# Patient Record
Sex: Female | Born: 1995
Health system: Southern US, Community
[De-identification: ages and names within clinical notes are randomized; demographics above are authoritative.]

---

## 2016-08-08 DIAGNOSIS — H52213 Irregular astigmatism, bilateral: Secondary | ICD-10-CM | POA: Diagnosis not present

## 2016-12-05 DIAGNOSIS — H52213 Irregular astigmatism, bilateral: Secondary | ICD-10-CM | POA: Diagnosis not present

## 2017-04-15 DIAGNOSIS — L910 Hypertrophic scar: Secondary | ICD-10-CM | POA: Diagnosis not present

## 2017-04-15 DIAGNOSIS — Z13220 Encounter for screening for lipoid disorders: Secondary | ICD-10-CM | POA: Diagnosis not present

## 2017-10-15 DIAGNOSIS — Z13220 Encounter for screening for lipoid disorders: Secondary | ICD-10-CM | POA: Diagnosis not present

## 2017-10-15 DIAGNOSIS — Z0000 Encounter for general adult medical examination without abnormal findings: Secondary | ICD-10-CM | POA: Diagnosis not present

## 2017-10-15 DIAGNOSIS — N912 Amenorrhea, unspecified: Secondary | ICD-10-CM | POA: Diagnosis not present

## 2017-10-15 DIAGNOSIS — L910 Hypertrophic scar: Secondary | ICD-10-CM | POA: Diagnosis not present

## 2017-10-29 DIAGNOSIS — L910 Hypertrophic scar: Secondary | ICD-10-CM | POA: Diagnosis not present

## 2017-10-29 DIAGNOSIS — L089 Local infection of the skin and subcutaneous tissue, unspecified: Secondary | ICD-10-CM | POA: Diagnosis not present

## 2017-10-29 DIAGNOSIS — L702 Acne varioliformis: Secondary | ICD-10-CM | POA: Diagnosis not present

## 2017-11-26 DIAGNOSIS — L910 Hypertrophic scar: Secondary | ICD-10-CM | POA: Diagnosis not present

## 2017-11-26 DIAGNOSIS — L702 Acne varioliformis: Secondary | ICD-10-CM | POA: Diagnosis not present

## 2017-11-26 DIAGNOSIS — L089 Local infection of the skin and subcutaneous tissue, unspecified: Secondary | ICD-10-CM | POA: Diagnosis not present

## 2017-12-31 DIAGNOSIS — L910 Hypertrophic scar: Secondary | ICD-10-CM | POA: Diagnosis not present

## 2017-12-31 DIAGNOSIS — L089 Local infection of the skin and subcutaneous tissue, unspecified: Secondary | ICD-10-CM | POA: Diagnosis not present

## 2018-02-04 DIAGNOSIS — L089 Local infection of the skin and subcutaneous tissue, unspecified: Secondary | ICD-10-CM | POA: Diagnosis not present

## 2018-02-04 DIAGNOSIS — L910 Hypertrophic scar: Secondary | ICD-10-CM | POA: Diagnosis not present

## 2018-03-04 DIAGNOSIS — L910 Hypertrophic scar: Secondary | ICD-10-CM | POA: Diagnosis not present

## 2018-03-04 DIAGNOSIS — L089 Local infection of the skin and subcutaneous tissue, unspecified: Secondary | ICD-10-CM | POA: Diagnosis not present

## 2018-04-01 DIAGNOSIS — L089 Local infection of the skin and subcutaneous tissue, unspecified: Secondary | ICD-10-CM | POA: Diagnosis not present

## 2018-04-01 DIAGNOSIS — L702 Acne varioliformis: Secondary | ICD-10-CM | POA: Diagnosis not present

## 2018-04-01 DIAGNOSIS — L910 Hypertrophic scar: Secondary | ICD-10-CM | POA: Diagnosis not present

## 2018-04-29 DIAGNOSIS — L089 Local infection of the skin and subcutaneous tissue, unspecified: Secondary | ICD-10-CM | POA: Diagnosis not present

## 2018-04-29 DIAGNOSIS — L910 Hypertrophic scar: Secondary | ICD-10-CM | POA: Diagnosis not present

## 2018-08-17 ENCOUNTER — Other Ambulatory Visit: Payer: Self-pay

## 2018-08-17 ENCOUNTER — Encounter: Payer: Self-pay | Admitting: Nurse Practitioner

## 2018-08-17 ENCOUNTER — Ambulatory Visit: Payer: 59 | Admitting: Nurse Practitioner

## 2018-08-17 VITALS — BP 140/76 | HR 83 | Temp 98.5°F | Ht 68.0 in | Wt 255.8 lb

## 2018-08-17 DIAGNOSIS — Z3A01 Less than 8 weeks gestation of pregnancy: Secondary | ICD-10-CM

## 2018-08-17 DIAGNOSIS — N912 Amenorrhea, unspecified: Secondary | ICD-10-CM | POA: Diagnosis not present

## 2018-08-17 DIAGNOSIS — Z3201 Encounter for pregnancy test, result positive: Secondary | ICD-10-CM | POA: Diagnosis not present

## 2018-08-17 LAB — POCT URINE PREGNANCY: Preg Test, Ur: POSITIVE — AB

## 2018-08-17 NOTE — Progress Notes (Signed)
  Subjective:     Patient ID: Terri Hensley , female    DOB: January 15, 1996 , 23 y.o.   MRN: [id number]   Chief Complaint  Patient presents with  . Possible Pregnancy    HPI  No LMP recorded. Patient is pregnant. She is unsure of the date of last menstrual cycle.    Possible Pregnancy This is a chronic problem. The current episode started more than 1 month ago. Pertinent negatives include no abdominal pain, arthralgias or headaches. Nothing aggravates the symptoms.     No past medical history on file.   Family History  Problem Relation Age of Onset  . Hypertension Mother   . Stroke Father     No current outpatient medications on file.   Not on File   Review of Systems  Constitutional: Negative.   Respiratory: Negative.   Cardiovascular: Negative.   Gastrointestinal: Negative for abdominal pain.  Musculoskeletal: Negative for arthralgias.  Neurological: Negative for dizziness and headaches.     Today's Vitals   08/17/18 0930  BP: 140/76  Pulse: 83  Temp: 98.5 F (36.9 C)  TempSrc: Oral  SpO2: 99%  Weight: 255 lb 12.8 oz (116 kg)  Height: _0  (1.727 m)   Body mass index is 38.89 kg/m.   Objective:  Physical Exam Constitutional:      Appearance: Normal appearance.  Cardiovascular:     Rate and Rhythm: Normal rate and regular rhythm.     Pulses: Normal pulses.     Heart sounds: Normal heart sounds. No murmur.  Pulmonary:     Effort: Pulmonary effort is normal.     Breath sounds: Normal breath sounds.  Skin:    General: Skin is warm and dry.     Capillary Refill: Capillary refill takes less than 2 seconds.  Neurological:     General: No focal deficit present.     Mental Status: She is alert and oriented to person, place, and time.  Psychiatric:        Mood and Affect: Mood normal.        Behavior: Behavior normal.        Thought Content: Thought content normal.        Judgment: Judgment normal.         Assessment And Plan:     1.  Amenorrhea  Positive urine pregnancy test - POCT Urine Pregnancy - CBC - BMP8+eGFR; Future  2. Less than [redacted] weeks gestation of pregnancy  Will refer to OB  Advised to only take tylenol for pain or fever as needed   Given prenatal vitamins - Ambulatory referral to Obstetrics / Gynecology    Minette Brine, FNP    THE PATIENT IS ENCOURAGED TO PRACTICE SOCIAL DISTANCING DUE TO THE COVID-19 PANDEMIC.

## 2018-08-17 NOTE — Patient Instructions (Signed)
Eating Plan for Pregnant Women While you are pregnant, your body requires additional nutrition to help support your growing baby. You also have a higher need for some vitamins and minerals, such as folic acid, calcium, iron, and vitamin D. Eating a healthy, well-balanced diet is very important for your health and your baby's health. Your need for extra calories varies for the three 76-monthsegments of your pregnancy (trimesters). For most women, it is recommended to consume:  150 extra calories a day during the first trimester.  300 extra calories a day during the second trimester.  300 extra calories a day during the third trimester. What are tips for following this plan?   Do not try to lose weight or go on a diet during pregnancy.  Limit your overall intake of foods that have "empty calories." These are foods that have little nutritional value, such as sweets, desserts, candies, and sugar-sweetened beverages.  Eat a variety of foods (especially fruits and vegetables) to get a full range of vitamins and minerals.  Take a prenatal vitamin to help meet your additional vitamin and mineral needs during pregnancy, specifically for folic acid, iron, calcium, and vitamin D.  Remember to stay active. Ask your health care provider what types of exercise and activities are safe for you.  Practice good food safety and cleanliness. Wash your hands before you eat and after you prepare raw meat. Wash all fruits and vegetables well before peeling or eating. Taking these actions can help to prevent food-borne illnesses that can be very dangerous to your baby, such as listeriosis. Ask your health care provider for more information about listeriosis. What does 150 extra calories look like? Healthy options that provide 150 extra calories each day could be any of the following:  6-8 oz (170-230 g) of plain low-fat yogurt with  cup of berries.  1 apple with 2 teaspoons (11 g) of peanut butter.  Cut-up  vegetables with  cup (60 g) of hummus.  8 oz (230 mL) or 1 cup of low-fat chocolate milk.  1 stick of string cheese with 1 medium orange.  1 peanut butter and jelly sandwich that is made with one slice of whole-wheat bread and 1 tsp (5 g) of peanut butter. For 300 extra calories, you could eat two of those healthy options each day. What is a healthy amount of weight to gain? The right amount of weight gain for you is based on your BMI before you became pregnant. If your BMI:  Was less than 18 (underweight), you should gain 28-40 lb (13-18 kg).  Was 18-24.9 (normal), you should gain 25-35 lb (11-16 kg).  Was 25-29.9 (overweight), you should gain 15-25 lb (7-11 kg).  Was 30 or greater (obese), you should gain 11-20 lb (5-9 kg). What if I am having twins or multiples? Generally, if you are carrying twins or multiples:  You may need to eat 300-600 extra calories a day.  The recommended range for total weight gain is 25-54 lb (11-25 kg), depending on your BMI before pregnancy.  Talk with your health care provider to find out about nutritional needs, weight gain, and exercise that is right for you. What foods can I eat?  Grains All grains. Choose whole grains, such as whole-wheat bread, oatmeal, or brown rice. Vegetables All vegetables. Eat a variety of colors and types of vegetables. Remember to wash your vegetables well before peeling or eating. Fruits All fruits. Eat a variety of colors and types of fruit. Remember to wash  your fruits well before peeling or eating. Meats and other protein foods Lean meats, including chicken, [place], fish, and lean cuts of beef, veal, or pork. If you eat fish or seafood, choose options that are higher in omega-3 fatty acids and lower in mercury, such as salmon, herring, mussels, trout, sardines, pollock, shrimp, crab, and lobster. Tofu. Tempeh. Beans. Eggs. Peanut butter and other nut butters. Make sure that all meats, poultry, and eggs are cooked to  food-safe temperatures or "well-done." Two or more servings of fish are recommended each week in order to get the most benefits from omega-3 fatty acids that are found in seafood. Choose fish that are lower in mercury. You can find more information online:  [employer]aming[employer]ch Dairy Pasteurized milk and milk alternatives (such as almond milk). Pasteurized yogurt and pasteurized cheese. Cottage cheese. Sour cream. Beverages Water. Juices that contain 100% fruit juice or vegetable juice. Caffeine-free teas and decaffeinated coffee. Drinks that contain caffeine are okay to drink, but it is better to avoid caffeine. Keep your total caffeine intake to less than 200 mg each day (which is 12 oz or 355 mL of coffee, tea, or soda) or the limit as told by your health care provider. Fats and oils Fats and oils are okay to include in moderation. Sweets and desserts Sweets and desserts are okay to include in moderation. Seasoning and other foods All pasteurized condiments. The items listed above may not be a complete list of recommended foods and beverages. Contact your dietitian for more options. What foods are not recommended? Vegetables Raw (unpasteurized) vegetable juices. Fruits Unpasteurized fruit juices. Meats and other protein foods Lunch meats, bologna, hot dogs, or other deli meats. (If you must eat those meats, reheat them until they are steaming hot.) Refrigerated pat, meat spreads from a meat counter, smoked seafood that is found in the refrigerated section of a store. Raw or undercooked meats, poultry, and eggs. Raw fish, such as sushi or sashimi. Fish that have high mercury content, such as tilefish, shark, swordfish, and king mackerel. To learn more about mercury in fish, talk with your health care provider or look for online resources, such as:  [employer]ch Dairy Raw (unpasteurized) milk and any foods that have raw milk in them. Soft cheeses, such as feta, queso blanco, queso fresco, Brie,  Camembert cheeses, blue-veined cheeses, and Panela cheese (unless it is made with pasteurized milk, which must be stated on the label). Beverages Alcohol. Sugar-sweetened beverages, such as sodas, teas, or energy drinks. Seasoning and other foods Homemade fermented foods and drinks, such as pickles, sauerkraut, or kombucha drinks. (Store-bought pasteurized versions of these are okay.) Salads that are made in a store or deli, such as ham salad, chicken salad, egg salad, tuna salad, and seafood salad. The items listed above may not be a complete list of foods and beverages to avoid. Contact your dietitian for more information. Where to find more information To calculate the number of calories you need based on your height, weight, and activity level, you can use an online calculator such as:  MobileTransition.ch To calculate how much weight you should gain during pregnancy, you can use an online pregnancy weight gain calculator such as:  StreamingFood.com.cy Summary  While you are pregnant, your body requires additional nutrition to help support your growing baby.  Eat a variety of foods, especially fruits and vegetables to get a full range of vitamins and minerals.  Practice good food safety and cleanliness. Wash your hands before you eat and after you  prepare raw meat. Wash all fruits and vegetables well before peeling or eating. Taking these actions can help to prevent food-borne illnesses, such as listeriosis, that can be very dangerous to your baby.  Do not eat raw meat or fish. Do not eat fish that have high mercury content, such as tilefish, shark, swordfish, and king mackerel. Do not eat unpasteurized (raw) dairy.  Take a prenatal vitamin to help meet your additional vitamin and mineral needs during pregnancy, specifically for folic acid, iron, calcium, and vitamin D. This information is not intended to replace advice given to you by your  health care provider. Make sure you discuss any questions you have with your health care provider. Document Released: 11/25/2013 Document Revised: 11/07/2016 Document Reviewed: 11/07/2016 Elsevier Interactive Patient Education  2019 [employer]. Pregnancy and Sex Your sex life may change during pregnancy as well as after your newborn arrives. It is normal to have questions about sex during pregnancy. All women are affected differently by pregnancy hormones. You may notice an increase or decrease in your sexual drive throughout your pregnancy. Also, your partner's attitude and sexual drive may change. Share the information in this document with your partner. Talk openly about how you feel about sex. When is it safe to have sex during pregnancy? Sex is generally considered safe throughout a normal low-risk pregnancy. Remember:  The fetus is protected by the uterus and the fluid-filled sac that surrounds the fetus (amniotic sac).  The cervix is closed or sealed during pregnancy.  The penis does not reach or harm the fetus during sex.  Sex and orgasms are not thought to cause miscarriages or early labor.  If you use lubricants, use a water-soluble product. What risk factors make it unsafe to have sex while pregnant? The following complications or risk factors may make it necessary to limit sexual activity:  You have a history of miscarriage or preterm labor.  You have bleeding, discharge, fluid leakage, or contractions.  Your placenta may be partially covering or completely covering the opening to the cervix (placenta previa).  Your cervix is weak and opens easily (incompetent cervix).  Your partner has an STD (sexually transmitted disease). Avoid sex with the infected person or use a condom to prevent infection to the fetus.  You are unsure of your partner's sexual history. Avoid sex or use condoms.  You are having twins, triples, or other multiples. Your health care provider will  help you determine whether sex during your pregnancy is safe. What practices are unsafe?  If you engage in oral sex, you should avoid having your partner blow air into your vagina. Although very rare, this can send a dangerous air bubble into your bloodstream.  Anal sex is generally safe during pregnancy, but there can be a risk of spreading bacteria from the rectum and aggravating any hemorrhoids. This information is not intended to replace advice given to you by your health care provider. Make sure you discuss any questions you have with your health care provider. Document Released: 07/31/2009 Document Revised: 10/09/2015 Document Reviewed: 08/02/2015 Elsevier Interactive Patient Education  2019 [employer]. Commonly Asked Questions During Pregnancy  Cats: A parasite can be excreted in cat feces.  To avoid exposure you need to have another person empty the little box.  If you must empty the litter box you will need to wear gloves.  Wash your hands after handling your cat.  This parasite can also be found in raw or undercooked meat so this should also be  avoided.  Colds, Sore Throats, Flu: Please check your medication sheet to see what you can take for symptoms.  If your symptoms are unrelieved by these medications please call the office.  Dental Work: Most any dental work Agricultural consultantyour dentist recommends is permitted.  X-rays should only be taken during the first trimester if absolutely necessary.  Your abdomen should be shielded with a lead apron during all x-rays.  Please notify your provider prior to receiving any x-rays.  Novocaine is fine; gas is not recommended.  If your dentist requires a note from us prior to dental work please call the office and we will provide one for you.  Exercise: Exercise is an important part of staying healthy during your pregnancy.  You may continue most exercises you were accustomed to prior to pregnancy.  Later in your pregnancy you will most likely notice you have  difficulty with activities requiring balance like riding a bicycle.  It is important that you listen to your body and avoid activities that put you at a higher risk of falling.  Adequate rest and staying well hydrated are a must!  If you have questions about the safety of specific activities ask your provider.    Exposure to Children with illness: Try to avoid obvious exposure; report any symptoms to us when noted,  If you have chicken pos, red measles or mumps, you should be immune to these diseases.   Please do not take any vaccines while pregnant unless you have checked with your OB provider.  Fetal Movement: After 28 weeks we recommend you do "kick counts" twice daily.  Lie or sit down in a calm quiet environment and count your baby movements "kicks".  You should feel your baby at least 10 times per hour.  If you have not felt 10 kicks within the first hour get up, walk around and have something sweet to eat or drink then repeat for an additional hour.  If count remains less than 10 per hour notify your provider.  Fumigating: Follow your pest control agent's advice as to how long to stay out of your home.  Ventilate the area well before re-entering.  Hemorrhoids:   Most over-the-counter preparations can be used during pregnancy.  Check your medication to see what is safe to use.  It is important to use a stool softener or fiber in your diet and to drink lots of liquids.  If hemorrhoids seem to be getting worse please call the office.   Hot Tubs:  Hot tubs Jacuzzis and saunas are not recommended while pregnant.  These increase your internal body temperature and should be avoided.  Intercourse:  Sexual intercourse is safe during pregnancy as long as you are comfortable, unless otherwise advised by your provider.  Spotting may occur after intercourse; report any bright red bleeding that is heavier than spotting.  Labor:  If you know that you are in labor, please go to the hospital.  If you are unsure,  please call the office and let us help you decide what to do.  Lifting, straining, etc:  If your job requires heavy lifting or straining please check with your provider for any limitations.  Generally, you should not lift items heavier than that you can lift simply with your hands and arms (no back muscles)  Painting:  Paint fumes do not harm your pregnancy, but may make you ill and should be avoided if possible.  Latex or water based paints have less odor than oils.  Use  adequate ventilation while painting.  Permanents & Hair Color:  Chemicals in hair dyes are not recommended as they cause increase hair dryness which can increase hair loss during pregnancy.  " Highlighting" and permanents are allowed.  Dye may be absorbed differently and permanents may not hold as well during pregnancy.  Sunbathing:  Use a sunscreen, as skin burns easily during pregnancy.  Drink plenty of fluids; avoid over heating.  Tanning Beds:  Because their possible side effects are still unknown, tanning beds are not recommended.  Ultrasound Scans:  Routine ultrasounds are performed at approximately 20 weeks.  You will be able to see your baby's general anatomy an if you would like to know the gender this can usually be determined as well.  If it is questionable when you conceived you may also receive an ultrasound early in your pregnancy for dating purposes.  Otherwise ultrasound exams are not routinely performed unless there is a medical necessity.  Although you can request a scan we ask that you pay for it when conducted because insurance does not cover " patient request" scans.  Work: If your pregnancy proceeds without complications you may work until your due date, unless your physician or employer advises otherwise.  Round Ligament Pain/Pelvic Discomfort:  Sharp, shooting pains not associated with bleeding are fairly common, usually occurring in the second trimester of pregnancy.  They tend to be worse when standing up  or when you remain standing for long periods of time.  These are the result of pressure of certain pelvic ligaments called "round ligaments".  Rest, Tylenol and heat seem to be the most effective relief.  As the womb and fetus grow, they rise out of the pelvis and the discomfort improves.  Please notify the office if your pain seems different than that described.  It may represent a more serious condition.

## 2018-08-18 LAB — BMP8+EGFR
BUN/Creatinine Ratio: 16 (ref 9–23)
BUN: 13 mg/dL (ref 6–20)
CO2: 23 mmol/L (ref 20–29)
Calcium: 9.9 mg/dL (ref 8.7–10.2)
Chloride: 100 mmol/L (ref 96–106)
Creatinine, Ser: 0.81 mg/dL (ref 0.57–1.00)
GFR calc Af Amer: 119 mL/min/{1.73_m2} (ref >59)
GFR calc non Af Amer: 103 mL/min/{1.73_m2} (ref >59)
Glucose: 97 mg/dL (ref 65–99)
Potassium: 4.7 mmol/L (ref 3.5–5.2)
Sodium: 137 mmol/L (ref 134–144)

## 2018-08-18 LAB — CBC
Hematocrit: 40.8 % (ref 34.0–46.6)
Hemoglobin: 12.7 g/dL (ref 11.1–15.9)
MCH: 25.1 pg — ABNORMAL LOW (ref 26.6–33.0)
MCHC: 31.1 g/dL — ABNORMAL LOW (ref 31.5–35.7)
MCV: 81 fL (ref 79–97)
Platelets: 365 10*3/uL (ref 150–450)
RBC: 5.06 x10E6/uL (ref 3.77–5.28)
RDW: 14 % (ref 11.7–15.4)
WBC: 6 10*3/uL (ref 3.4–10.8)

## 2018-08-22 ENCOUNTER — Encounter: Payer: Self-pay | Admitting: Nurse Practitioner

## 2018-08-23 NOTE — Progress Notes (Signed)
Yes that is not abnormal, just make sure if she has any problems she can call them first to see if they will see her early or call us

## 2018-09-03 ENCOUNTER — Inpatient Hospital Stay (HOSPITAL_COMMUNITY)
Admission: AD | Admit: 2018-09-03 | Discharge: 2018-09-03 | Disposition: A | Discharge status: Home / Self Care | Payer: 59 | Attending: Obstetrics and Gynecology | Admitting: Obstetrics and Gynecology

## 2018-09-03 ENCOUNTER — Other Ambulatory Visit: Payer: Self-pay

## 2018-09-03 ENCOUNTER — Encounter (HOSPITAL_COMMUNITY): Payer: Self-pay | Admitting: Student

## 2018-09-03 ENCOUNTER — Inpatient Hospital Stay (HOSPITAL_COMMUNITY): Payer: 59

## 2018-09-03 DIAGNOSIS — Z3A12 12 weeks gestation of pregnancy: Secondary | ICD-10-CM | POA: Diagnosis not present

## 2018-09-03 DIAGNOSIS — O209 Hemorrhage in early pregnancy, unspecified: Secondary | ICD-10-CM | POA: Insufficient documentation

## 2018-09-03 DIAGNOSIS — O3680X0 Pregnancy with inconclusive fetal viability, not applicable or unspecified: Secondary | ICD-10-CM | POA: Diagnosis not present

## 2018-09-03 LAB — URINALYSIS, ROUTINE W REFLEX MICROSCOPIC
Bilirubin Urine: NEGATIVE
Glucose, UA: NEGATIVE mg/dL
Hgb urine dipstick: NEGATIVE
Ketones, ur: NEGATIVE mg/dL
Leukocytes,Ua: NEGATIVE
Nitrite: NEGATIVE
Protein, ur: NEGATIVE mg/dL
Specific Gravity, Urine: 1.023 (ref 1.005–1.030)
pH: 6 (ref 5.0–8.0)

## 2018-09-03 LAB — HCG, QUANTITATIVE, PREGNANCY: hCG, Beta Chain, Quant, S: 50 m[IU]/mL — ABNORMAL HIGH (ref <5)

## 2018-09-03 LAB — CBC
HCT: 39 % (ref 36.0–46.0)
Hemoglobin: 12.4 g/dL (ref 12.0–15.0)
MCH: 25.6 pg — ABNORMAL LOW (ref 26.0–34.0)
MCHC: 31.8 g/dL (ref 30.0–36.0)
MCV: 80.4 fL (ref 80.0–100.0)
Platelets: 338 10*3/uL (ref 150–400)
RBC: 4.85 MIL/uL (ref 3.87–5.11)
RDW: 13.6 % (ref 11.5–15.5)
WBC: 5.5 10*3/uL (ref 4.0–10.5)
nRBC: 0 % (ref 0.0–0.2)

## 2018-09-03 LAB — WET PREP, GENITAL
Clue Cells Wet Prep HPF POC: NONE SEEN
Sperm: NONE SEEN
Trich, Wet Prep: NONE SEEN
Yeast Wet Prep HPF POC: NONE SEEN

## 2018-09-03 LAB — ABO/RH: ABO/RH(D): O POS

## 2018-09-03 NOTE — [facility] Note (Signed)
.   Terri Hensley is a 23 y.o. at [redacted]w[redacted]d here in [facility] reporting: that she had small amount of vaginal bleeding this morning. Also reports that she has had some constipation with her PNV. Denies any pain LMP: 06/09/18 Onset of complaint: this morning Pain score: 0 Vitals:   09/03/18 1043  BP: (!) 141/67  Pulse: 83  Resp: 16  Temp: 98.4 F (36.9 C)  SpO2: 100%      Lab orders placed from triage: [facility]

## 2018-09-03 NOTE — Discharge Instructions (Signed)
Return to care   If you have heavier bleeding that soaks through more that 2 pads per hour for an hour or more  If you bleed so much that you feel like you might pass out or you do pass out  If you have significant abdominal pain that is not improved with Tylenol      Miscarriage A miscarriage is the loss of an unborn baby (fetus) before the 20th week of pregnancy. Most miscarriages happen during the first 3 months of pregnancy. Sometimes, a miscarriage can happen before a woman knows that she is pregnant. Having a miscarriage can be an emotional experience. If you have had a miscarriage, talk with your health care provider about any questions you may have about miscarrying, the grieving process, and your plans for future pregnancy. What are the causes? A miscarriage may be caused by:  Problems with the genes or chromosomes of the fetus. These problems make it impossible for the baby to develop normally. They are often the result of random errors that occur early in the development of the baby, and are not passed from parent to child (not inherited).  Infection of the cervix or uterus.  Conditions that affect hormone balance in the body.  Problems with the cervix, such as the cervix opening and thinning before pregnancy is at term (cervical insufficiency).  Problems with the uterus. These may include: ? A uterus with an abnormal shape. ? Fibroids in the uterus. ? Congenital abnormalities. These are problems that were present at birth.  Certain medical conditions.  Smoking, drinking alcohol, or using drugs.  Injury (trauma). In many cases, the cause of a miscarriage is not known. What are the signs or symptoms? Symptoms of this condition include:  Vaginal bleeding or spotting, with or without cramps or pain.  Pain or cramping in the abdomen or lower back.  Passing fluid, tissue, or blood clots from the vagina. How is this diagnosed? This condition may be diagnosed based  on:  A physical exam.  Ultrasound.  Blood tests.  Urine tests. How is this treated? Treatment for a miscarriage is sometimes not necessary if you naturally pass all the tissue that was in your uterus. If necessary, this condition may be treated with:  Dilation and curettage (D&C). This is a procedure in which the cervix is stretched open and the lining of the uterus (endometrium) is scraped. This is done only if tissue from the fetus or placenta remains in the body (incomplete miscarriage).  Medicines, such as: ? Antibiotic medicine, to treat infection. ? Medicine to help the body pass any remaining tissue. ? Medicine to reduce (contract) the size of the uterus. These medicines may be given if you have a lot of bleeding. If you have Rh negative blood and your baby was Rh positive, you will need a shot of a medicine called Rh immunoglobulinto protect your future babies from Rh blood problems. "Rh-negative" and "Rh-positive" refer to whether or not the blood has a specific protein found on the surface of red blood cells (Rh factor). Follow these instructions at home: Medicines   Take over-the-counter and prescription medicines only as told by your health care provider.  If you were prescribed antibiotic medicine, take it as told by your health care provider. Do not stop taking the antibiotic even if you start to feel better.  Do not take NSAIDs, such as aspirin and ibuprofen, unless they are approved by your health care provider. These medicines can cause bleeding. Activity  Rest as directed. Ask your health care provider what activities are safe for you.  Have someone help with home and family responsibilities during this time. General instructions  Keep track of the number of sanitary pads you use each day and how soaked (saturated) they are. Write down this information.  Monitor the amount of tissue or blood clots that you pass from your vagina. Save any large amounts of tissue  for your health care provider to examine.  Do not use tampons, douche, or have sex until your health care provider approves.  To help you and your partner with the process of grieving, talk with your health care provider or seek counseling.  When you are ready, meet with your health care provider to discuss any important steps you should take for your health. Also, discuss steps you should take to have a healthy pregnancy in the future.  Keep all follow-up visits as told by your health care provider. This is important. Where to find more information  The American Congress of Obstetricians and Gynecologists: www.acog.org  U.[place] Department of Health and [employer]ist[employer] Services Office of Womens Health: http://hoffman.com/www.womenshealth.gov Contact a health care provider if:  You have a fever or chills.  You have a foul smelling vaginal discharge.  You have more bleeding instead of less. Get help right away if:  You have severe cramps or pain in your back or abdomen.  You pass blood clots or tissue from your vagina that is walnut-sized or larger.  You soak more than 1 regular sanitary pad in an hour.  You become light-headed or weak.  You pass out.  You have feelings of sadness that take over your thoughts, or you have thoughts of hurting yourself. Summary  Most miscarriages happen in the first 3 months of pregnancy. Sometimes miscarriage happens before a woman even knows that she is pregnant.  Follow your health care provider's instruction for home care. Keep all follow-up appointments.  To help you and your partner with the process of grieving, talk with your health care provider or seek counseling. This information is not intended to replace advice given to you by your health care provider. Make sure you discuss any questions you have with your health care provider. Document Released: 08/06/2000 Document Revised: 06/04/2018 Document Reviewed: 03/18/2016 Elsevier Patient Education  2020 [employer]

## 2018-09-03 NOTE — MAU Provider Note (Signed)
Chief Complaint: Vaginal Bleeding   First Provider Initiated Contact with Patient 09/03/18 1112     SUBJECTIVE HPI: Terri Hensley is a 23 y.o. G1P0 at 2323w2d by very unsure LMP who presents to Maternity Admissions reporting vaginal bleeding. Reports bright red spotting on toilet paper since this morning. Not bleeding into a pad and no clots. Denies abdominal pain. No change in vaginal discharge. No recent intercourse.   History reviewed. No pertinent past medical history. OB History  Gravida Para Term Preterm AB Living  1            SAB TAB Ectopic Multiple Live Births               # Outcome Date GA Lbr Len/2nd Weight Sex Delivery Anes PTL Lv  1 Current            Past Surgical History:  Procedure Laterality Date  . ANTERIOR CRUCIATE LIGAMENT REPAIR     Social History   Socioeconomic History  . Marital status: Single    Spouse name: Not on file  . Number of children: Not on file  . Years of education: Not on file  . Highest education level: Not on file  Occupational History  . Not on file  Social Needs  . Financial resource strain: Not on file  . Food insecurity    Worry: Not on file    Inability: Not on file  . Transportation needs    Medical: Not on file    Non-medical: Not on file  Tobacco Use  . Smoking status: Never Smoker  . Smokeless tobacco: Never Used  Substance and Sexual Activity  . Alcohol use: Not Currently  . Drug use: Not Currently  . Sexual activity: Yes  Lifestyle  . Physical activity    Days per week: Not on file    Minutes per session: Not on file  . Stress: Not on file  Relationships  . Social Musicianconnections    Talks on phone: Not on file    Gets together: Not on file    Attends religious service: Not on file    Active member of club or organization: Not on file    Attends meetings of clubs or organizations: Not on file    Relationship status: Not on file  . Intimate partner violence    Fear of current or ex partner: Not on file     Emotionally abused: Not on file    Physically abused: Not on file    Forced sexual activity: Not on file  Other Topics Concern  . Not on file  Social History Narrative  . Not on file   Family History  Problem Relation Age of Onset  . Hypertension Mother   . Stroke Father    No current facility-administered medications on file prior to encounter.    Current Outpatient Medications on File Prior to Encounter  Medication Sig Dispense Refill  . Prenatal Vit-Fe Fumarate-FA (PRENATAL MULTIVITAMIN) TABS tablet Take 1 tablet by mouth daily at 12 noon.     No Known Allergies  I have reviewed patient's Past Medical Hx, Surgical Hx, Family Hx, Social Hx, medications and allergies.   Review of Systems  Constitutional: Negative.   Gastrointestinal: Negative.   Genitourinary: Positive for vaginal bleeding. Negative for dysuria and vaginal discharge.    OBJECTIVE Patient Vitals for the past 24 hrs:  BP Temp Temp src Pulse Resp SpO2 Height Weight  09/03/18 1353 133/68 98.2 F (36.8 C) Oral 70  16 100 % - -  09/03/18 1043 (!) 141/67 98.4 F (36.9 C) - 83 16 100 % 5\' 8"  (1.727 m) 113.4 kg   Constitutional: Well-developed, well-nourished female in no acute distress.  Cardiovascular: normal rate & rhythm, no murmur Respiratory: normal rate and effort. Lung sounds clear throughout GI: Abd soft, non-tender, Pos BS x 4. No guarding or rebound tenderness MS: Extremities nontender, no edema, normal ROM Neurologic: Alert and oriented x 4.  GU:     SPECULUM EXAM: NEFG, physiologic discharge, no blood noted, cervix clean    LAB RESULTS Results for orders placed or performed during the hospital encounter of 09/03/18 (from the past 24 hour(s))  Urinalysis, Routine w reflex microscopic     Status: None   Collection Time: 09/03/18 11:15 AM  Result Value Ref Range   Color, Urine YELLOW YELLOW   APPearance CLEAR CLEAR   Specific Gravity, Urine 1.023 1.005 - 1.030   pH 6.0 5.0 - 8.0   Glucose, UA  NEGATIVE NEGATIVE mg/dL   Hgb urine dipstick NEGATIVE NEGATIVE   Bilirubin Urine NEGATIVE NEGATIVE   Ketones, ur NEGATIVE NEGATIVE mg/dL   Protein, ur NEGATIVE NEGATIVE mg/dL   Nitrite NEGATIVE NEGATIVE   Leukocytes,Ua NEGATIVE NEGATIVE  ABO/Rh     Status: None   Collection Time: 09/03/18 11:29 AM  Result Value Ref Range   ABO/RH(D) O POS    No rh immune globuloin      NOT A RH IMMUNE GLOBULIN CANDIDATE, PT RH POSITIVE Performed at [facility], [address] [place], [place] [address]   CBC     Status: Abnormal   Collection Time: 09/03/18 11:30 AM  Result Value Ref Range   WBC 5.5 4.0 - 10.5 K/uL   RBC 4.85 3.87 - 5.11 MIL/uL   Hemoglobin 12.4 12.0 - 15.0 g/dL   HCT 53.639.0 64.436.0 - 03.446.0 %   MCV 80.4 80.0 - 100.0 fL   MCH 25.6 (L) 26.0 - 34.0 pg   MCHC 31.8 30.0 - 36.0 g/dL   RDW 74.213.6 59.511.5 - 63.815.5 %   Platelets 338 150 - 400 K/uL   nRBC 0.0 0.0 - 0.2 %  hCG, quantitative, pregnancy     Status: Abnormal   Collection Time: 09/03/18 11:30 AM  Result Value Ref Range   hCG, Beta Chain, Quant, S 50 (H) <5 mIU/mL  Wet prep, genital     Status: Abnormal   Collection Time: 09/03/18 11:30 AM  Result Value Ref Range   Yeast Wet Prep HPF POC NONE SEEN NONE SEEN   Trich, Wet Prep NONE SEEN NONE SEEN   Clue Cells Wet Prep HPF POC NONE SEEN NONE SEEN   WBC, Wet Prep HPF POC MANY (A) NONE SEEN   Sperm NONE SEEN     IMAGING [place] Ob Less Than 14 Weeks With Ob Transvaginal  Result Date: 09/03/2018 CLINICAL DATA:  Vaginal bleeding in 1st trimester pregnancy. EXAM: OBSTETRIC <14 WK US AND TRANSVAGINAL OB US TECHNIQUE: Both transabdominal and transvaginal ultrasound examinations were performed for complete evaluation of the gestation as well as the maternal uterus, adnexal regions, and pelvic cul-de-sac. Transvaginal technique was performed to assess early pregnancy. COMPARISON:  None. FINDINGS: Intrauterine gestational sac: None Maternal uterus/adnexae: Retroverted uterus. Intramural fibroid  in the fundus is seen measuring 3.9 cm. Both ovaries are normal in appearance. No adnexal mass or abnormal free fluid identified. IMPRESSION: Pregnancy of unknown anatomic location (no intrauterine gestational sac or adnexal mass identified). Differential diagnosis includes  recent spontaneous abortion, IUP too early to visualize, and non-visualized ectopic pregnancy. Recommend correlation with serial beta-hCG levels, and follow up US if warranted clinically. Retroverted uterus, with 3.9 cm fundal fibroid. Electronically Signed   By: Marlaine Hind M.D.   On: 09/03/2018 12:54    MAU COURSE Orders Placed This Encounter  Procedures  . Wet prep, genital  . US OB LESS THAN 14 WEEKS WITH OB TRANSVAGINAL  . Urinalysis, Routine w reflex microscopic  . CBC  . hCG, quantitative, pregnancy  . ABO/Rh  . Discharge patient   No orders of the defined types were placed in this encounter.   MDM +UPT UA, wet prep, GC/chlamydia, CBC, ABO/Rh, quant hCG, and [place] today to rule out ectopic pregnancy  RH positive  Ultrasound shows no IUP or adnexal mass HCG only 50 Discussed with patient that this is likely a miscarriage given that her first positive HPT was 3 weeks ago. Will bring back for repeat HCG on Sunday.   ASSESSMENT 1. Pregnancy of unknown anatomic location   2. Vaginal bleeding in pregnancy, first trimester     PLAN Discharge home in stable condition. SAB vs ectopic precautions GC/CT pending  Follow-up Information    [facility]. Go on 09/05/2018.   Specialty: Obstetrics and Gynecology Why: for labs Contact information: [address] [id number] [place] [phone]         Allergies as of 09/03/2018   No Known Allergies     Medication List    TAKE these medications   prenatal multivitamin Tabs tablet Take 1 tablet by mouth daily at 12 noon.        Jorje Guild, NP 09/03/2018  7:31 PM

## 2018-09-05 ENCOUNTER — Inpatient Hospital Stay (HOSPITAL_COMMUNITY)
Admission: AD | Admit: 2018-09-05 | Discharge: 2018-09-05 | Disposition: A | Discharge status: Home / Self Care | Payer: 59 | Attending: Obstetrics and Gynecology | Admitting: Obstetrics and Gynecology

## 2018-09-05 ENCOUNTER — Other Ambulatory Visit: Payer: Self-pay

## 2018-09-05 DIAGNOSIS — Z3A12 12 weeks gestation of pregnancy: Secondary | ICD-10-CM | POA: Diagnosis not present

## 2018-09-05 DIAGNOSIS — O4691 Antepartum hemorrhage, unspecified, first trimester: Secondary | ICD-10-CM | POA: Diagnosis not present

## 2018-09-05 DIAGNOSIS — O26891 Other specified pregnancy related conditions, first trimester: Secondary | ICD-10-CM | POA: Insufficient documentation

## 2018-09-05 DIAGNOSIS — O3680X0 Pregnancy with inconclusive fetal viability, not applicable or unspecified: Secondary | ICD-10-CM | POA: Diagnosis not present

## 2018-09-05 LAB — HCG, QUANTITATIVE, PREGNANCY: hCG, Beta Chain, Quant, S: 44 m[IU]/mL — ABNORMAL HIGH (ref <5)

## 2018-09-05 NOTE — MAU Note (Signed)
Pt okay to leave after blood is drawn per Purcell Nails NP

## 2018-09-05 NOTE — MAU Note (Signed)
[place] NP informs this RN that pt can come off the board. Pt left unit at approximately 0930, had no pain at time of leaving the unit

## 2018-09-05 NOTE — MAU Note (Signed)
Terri Hensley is a 23 y.o. at [redacted]w[redacted]d here in MAU reporting: here for follow up hcg, denies any pain or bleeding.   Pain score: 0/10  Vitals:   09/05/18 0905  BP: 126/66  Pulse: 71  Resp: 16  Temp: 98.6 F (37 C)  SpO2: 100%      Lab orders placed from triage: hcg

## 2018-09-05 NOTE — Discharge Instructions (Signed)
Return to care  °· If you have heavier bleeding that soaks through more that 2 pads per hour for an hour or more °· If you bleed so much that you feel like you might pass out or you do pass out °· If you have significant abdominal pain that is not improved with Tylenol  °· If you develop a fever > 100.5 ° °

## 2018-09-05 NOTE — MAU Provider Note (Signed)
Ms. DANAH REINECKE  is a 23 y.o. G1P0  at [redacted]w[redacted]d who presents to MAU today for follow-up quant hCG. The patient denies abdominal pain, vaginal bleeding, N/V or fever.   BP 126/66 (BP Location: Right Arm)   Pulse 71   Temp 98.6 F (37 C) (Oral)   Resp 16   LMP 06/09/2018   SpO2 100%   GENERAL: Well-developed, well-nourished female in no acute distress.  HEENT: Normocephalic, atraumatic.   LUNGS: Effort normal HEART: Regular rate  SKIN: Warm, dry and without erythema PSYCH: Normal mood and affect   A: 1. Pregnancy of unknown anatomic location   -HCG has dropped to 44. Likely a miscarriage but hasn't dropped >50%. Will have patient f/u for repeat HCG in the office.  Component     Latest Ref Rng & Units 09/03/2018 09/05/2018  HCG, Beta Chain, Quant, S     <5 mIU/mL 50 (H) 44 (H)      P: Discharge home Bleeding/Ectopic precautions discussed [place] ([place]) notified of patient & will get scheduled for f/u HCG on Tuesday   Jorje Guild, NP  09/05/2018 9:08 AM

## 2018-09-06 LAB — GC/CHLAMYDIA PROBE AMP (~~LOC~~) NOT AT ARMC
Chlamydia: NEGATIVE
Neisseria Gonorrhea: NEGATIVE

## 2018-10-22 ENCOUNTER — Encounter: Payer: Self-pay | Admitting: Nurse Practitioner

## 2018-10-26 ENCOUNTER — Encounter: Payer: Self-pay | Admitting: Nurse Practitioner

## 2020-07-11 IMAGING — US OBSTETRIC <14 WK US AND TRANSVAGINAL OB US
1 series · 15 of 28 positions shown · non-contrast
Comparison: None.

CLINICAL DATA: Vaginal bleeding in 1st trimester pregnancy.

EXAM:
OBSTETRIC <14 WK US AND TRANSVAGINAL OB US
TECHNIQUE: Both transabdominal and transvaginal ultrasound examinations were
performed for complete evaluation of the gestation as well as the
maternal uterus, adnexal regions, and pelvic cul-de-sac.
Transvaginal technique was performed to assess early pregnancy.

[Series 1: obstetric <14 wk us and transvaginal ob us · 55 acquisitions, 15 frames shown]
[im 1/55]
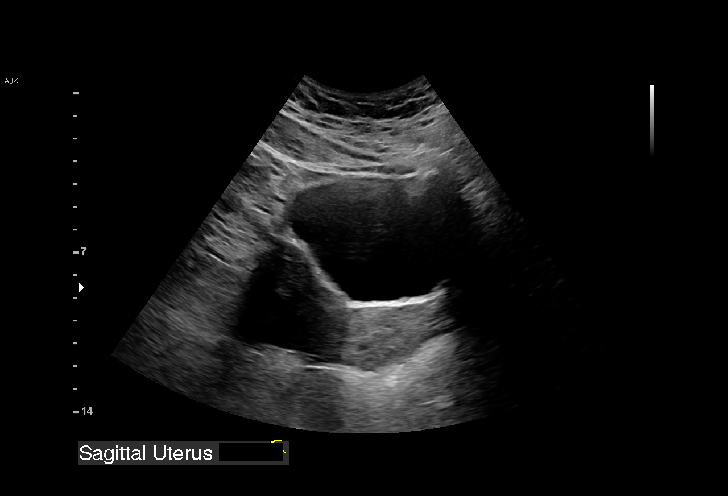
[im 5/55]
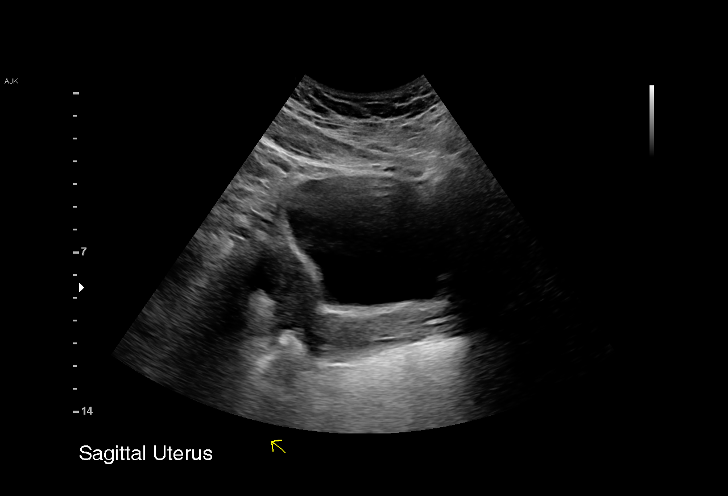
[im 9/55]
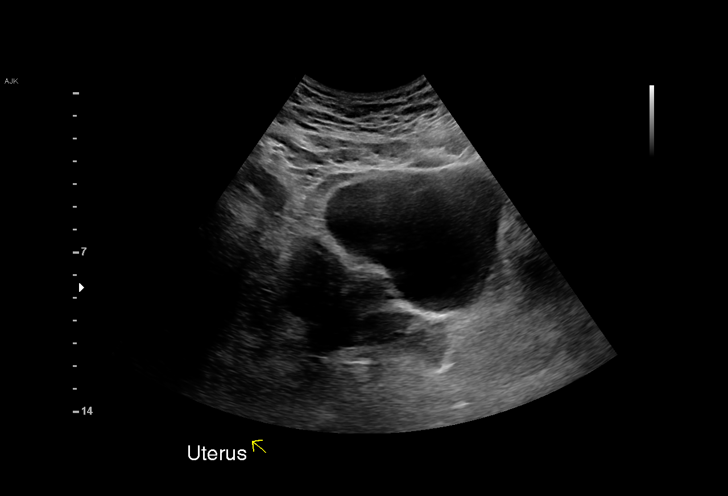
[im 13/55]
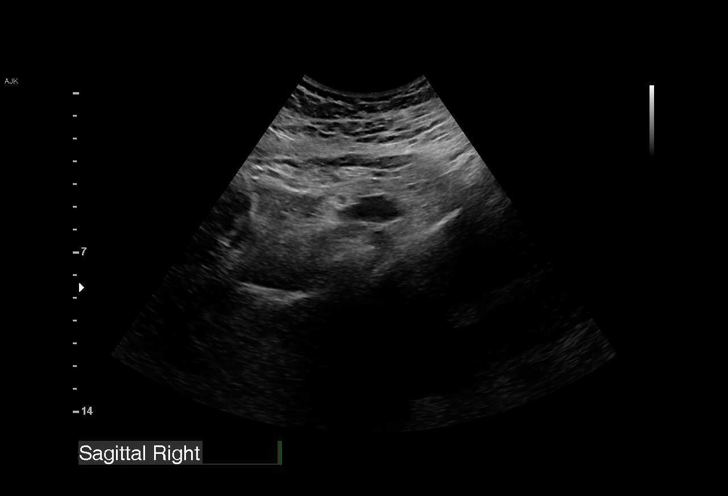
[im 17/55]
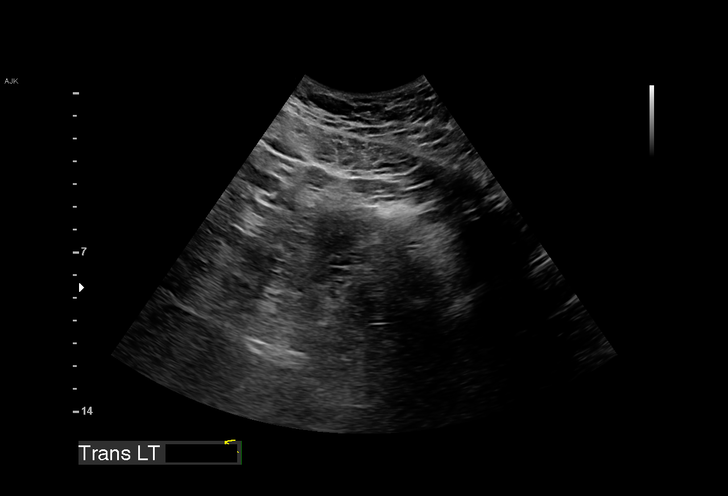
[im 21/55]
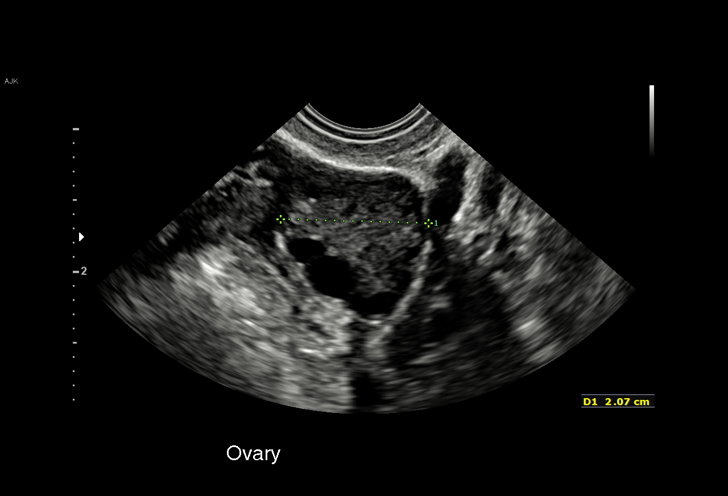
[im 25/55]
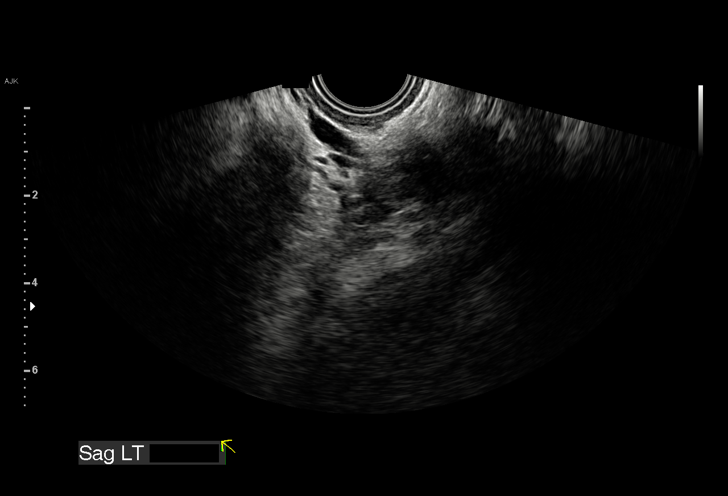
[im 29/55]
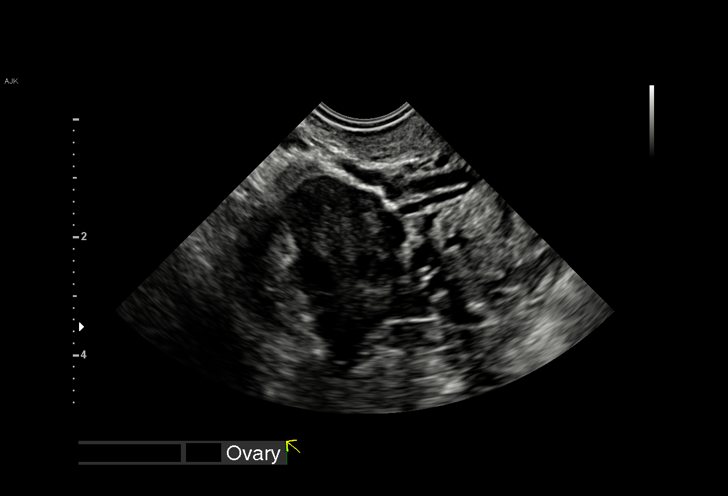
[im 31/55]
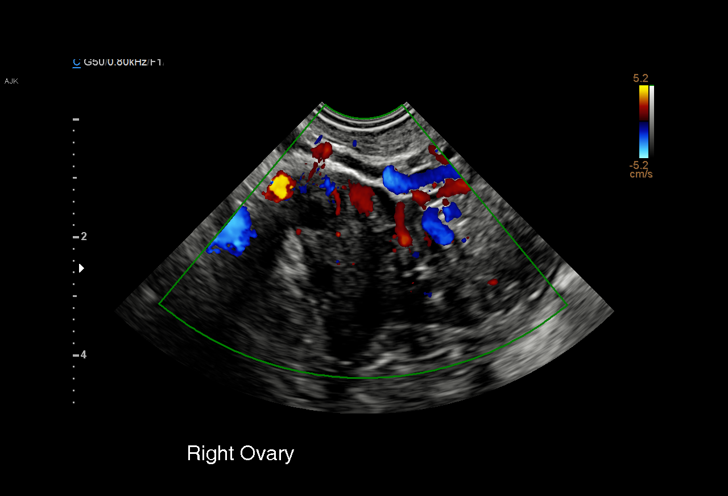
[im 35/55]
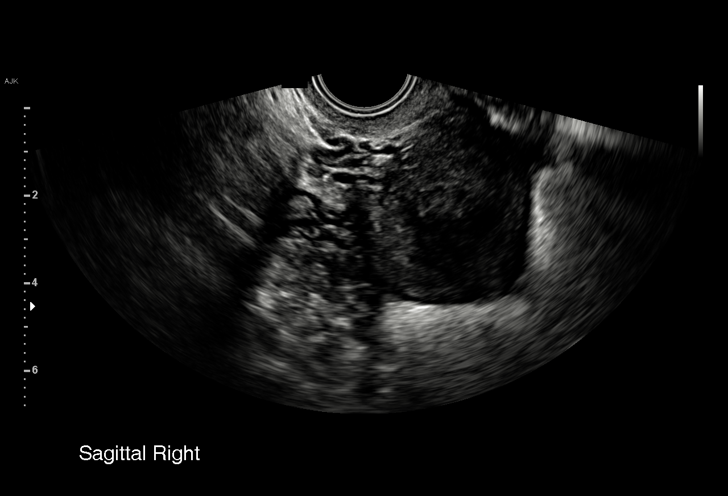
[im 39/55]
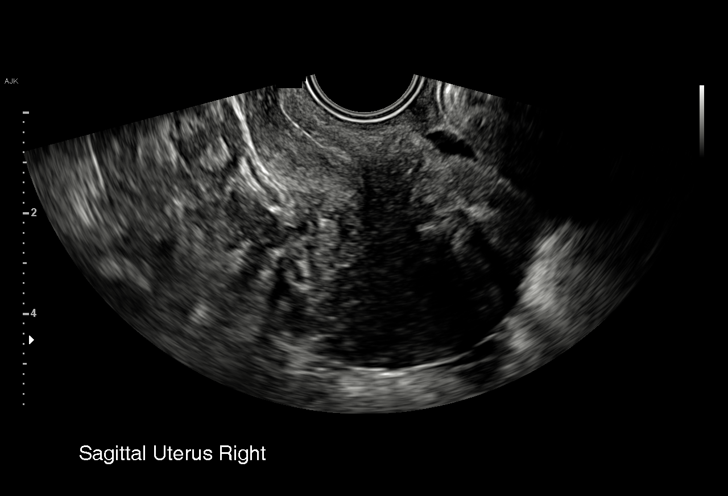
[im 43/55]
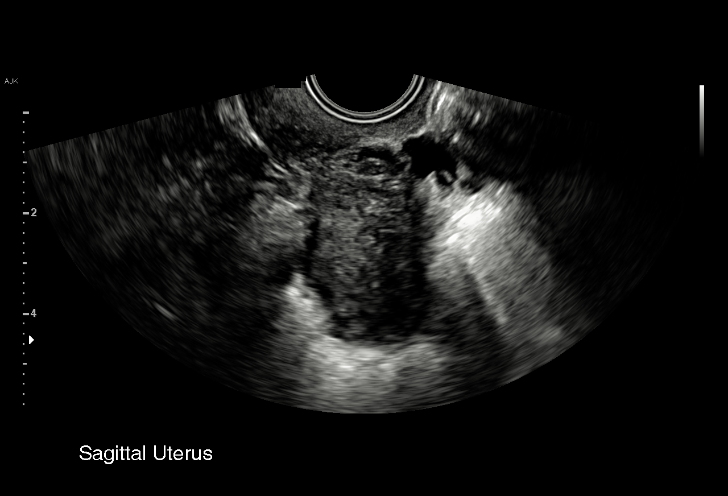
[im 47/55]
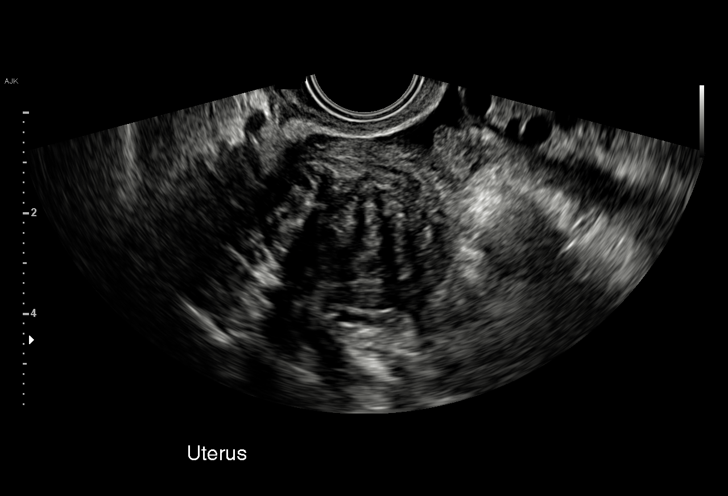
[im 51/55]
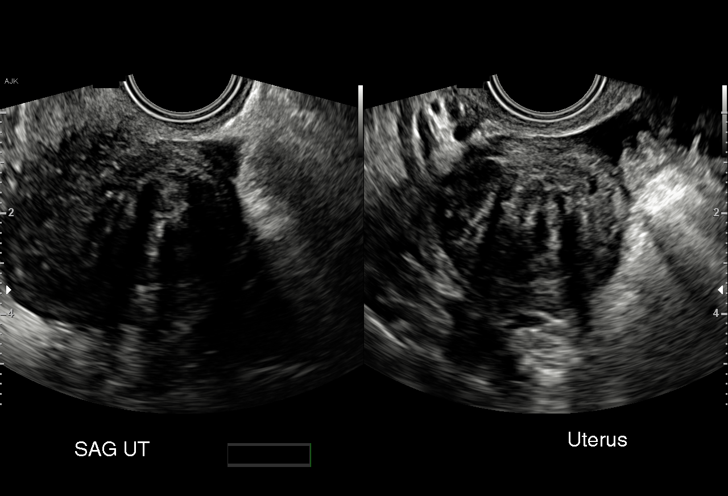
[im 55/55]
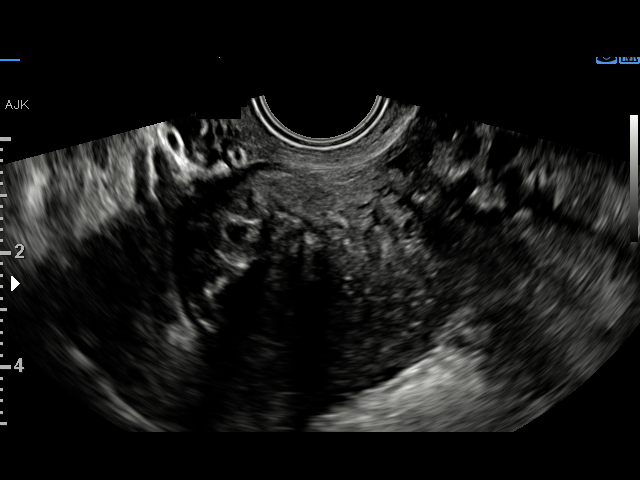

[15 of 28 positions shown; findings below may reference images not displayed]

FINDINGS: Intrauterine gestational sac: None

Maternal uterus/adnexae: Retroverted uterus. Intramural fibroid in
the fundus is seen measuring 3.9 cm. Both ovaries are normal in
appearance. No adnexal mass or abnormal free fluid identified.
IMPRESSION: Pregnancy of unknown anatomic location (no intrauterine gestational
sac or adnexal mass identified). Differential diagnosis includes
recent spontaneous abortion, IUP too early to visualize, and
non-visualized ectopic pregnancy. Recommend correlation with serial
beta-hCG levels, and follow up US if warranted clinically.

Retroverted uterus, with 3.9 cm fundal fibroid.

## 2023-06-16 ENCOUNTER — Other Ambulatory Visit: Payer: Self-pay | Admitting: Family

## 2023-06-16 DIAGNOSIS — D259 Leiomyoma of uterus, unspecified: Secondary | ICD-10-CM

## 2023-07-25 ENCOUNTER — Ambulatory Visit
Admission: RE | Admit: 2023-07-25 | Discharge: 2023-07-25 | Disposition: A | Discharge status: Home / Self Care | Source: Ambulatory Visit | Attending: Family | Admitting: Family

## 2023-07-25 DIAGNOSIS — D259 Leiomyoma of uterus, unspecified: Secondary | ICD-10-CM

## 2023-07-25 MED ORDER — GADOPICLENOL 0.5 MMOL/ML IV SOLN
10.0000 mL | Freq: Once | INTRAVENOUS | Status: AC | PRN
  Administered 2023-07-25: 10 mL via INTRAVENOUS
